# Patient Record
Sex: Male | Born: 1995 | Race: White | Hispanic: No | Marital: Single | Smoking: Former smoker
Health system: Southern US, Community
[De-identification: ages and names within clinical notes are randomized; demographics above are authoritative.]

## PROBLEM LIST (undated history)

## (undated) DIAGNOSIS — J45909 Unspecified asthma, uncomplicated: Secondary | ICD-10-CM

## (undated) HISTORY — PX: LYMPH NODE DISSECTION: SHX5087

---
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
## 2015-03-03 ENCOUNTER — Ambulatory Visit (HOSPITAL_BASED_OUTPATIENT_CLINIC_OR_DEPARTMENT_OTHER)
Admission: RE | Admit: 2015-03-03 | Payer: Commercial Managed Care - PPO | Source: Ambulatory Visit | Admitting: Otolaryngology

## 2015-03-03 ENCOUNTER — Encounter (HOSPITAL_BASED_OUTPATIENT_CLINIC_OR_DEPARTMENT_OTHER): Admission: RE | Payer: Self-pay | Source: Ambulatory Visit

## 2015-03-03 SURGERY — TONSILLECTOMY AND ADENOIDECTOMY
Anesthesia: General
